# Patient Record
Sex: Female | Born: 1955 | Race: White | Hispanic: No | Marital: Single | Smoking: Never smoker
Health system: Southern US, Community
[De-identification: ages and names within clinical notes are randomized; demographics above are authoritative.]

## PROBLEM LIST (undated history)

## (undated) DIAGNOSIS — E7800 Pure hypercholesterolemia, unspecified: Secondary | ICD-10-CM

---

## 2013-12-03 ENCOUNTER — Emergency Department (HOSPITAL_COMMUNITY)
Admission: EM | Admit: 2013-12-03 | Discharge: 2013-12-03 | Disposition: A | Discharge status: Home / Self Care | Payer: BC Managed Care – PPO | Attending: Emergency Medicine | Admitting: Emergency Medicine

## 2013-12-03 ENCOUNTER — Encounter (HOSPITAL_COMMUNITY): Payer: Self-pay | Admitting: Emergency Medicine

## 2013-12-03 DIAGNOSIS — Z79899 Other long term (current) drug therapy: Secondary | ICD-10-CM | POA: Insufficient documentation

## 2013-12-03 DIAGNOSIS — E7800 Pure hypercholesterolemia, unspecified: Secondary | ICD-10-CM | POA: Insufficient documentation

## 2013-12-03 DIAGNOSIS — Z7982 Long term (current) use of aspirin: Secondary | ICD-10-CM | POA: Insufficient documentation

## 2013-12-03 DIAGNOSIS — R109 Unspecified abdominal pain: Secondary | ICD-10-CM | POA: Insufficient documentation

## 2013-12-03 DIAGNOSIS — Z880 Allergy status to penicillin: Secondary | ICD-10-CM | POA: Insufficient documentation

## 2013-12-03 DIAGNOSIS — Z791 Long term (current) use of non-steroidal anti-inflammatories (NSAID): Secondary | ICD-10-CM | POA: Insufficient documentation

## 2013-12-03 HISTORY — DX: Pure hypercholesterolemia, unspecified: E78.00

## 2013-12-03 LAB — COMPREHENSIVE METABOLIC PANEL
ALT: 15 U/L (ref 0–35)
AST: 17 U/L (ref 0–37)
Alkaline Phosphatase: 59 U/L (ref 39–117)
CO2: 25 mEq/L (ref 19–32)
Calcium: 9.3 mg/dL (ref 8.4–10.5)
Chloride: 99 mEq/L (ref 96–112)
GFR calc Af Amer: >90 mL/min (ref >90)
GFR calc non Af Amer: >90 mL/min (ref >90)
Glucose, Bld: 140 mg/dL — ABNORMAL HIGH (ref 70–99)
Potassium: 3.8 mEq/L (ref 3.5–5.1)
Sodium: 138 mEq/L (ref 135–145)
Total Bilirubin: 0.4 mg/dL (ref 0.3–1.2)

## 2013-12-03 LAB — CBC WITH DIFFERENTIAL/PLATELET
Basophils Absolute: 0 10*3/uL (ref 0.0–0.1)
Eosinophils Relative: 1 % (ref 0–5)
Lymphocytes Relative: 10 % — ABNORMAL LOW (ref 12–46)
Lymphs Abs: 0.6 10*3/uL — ABNORMAL LOW (ref 0.7–4.0)
MCH: 31 pg (ref 26.0–34.0)
MCV: 88.1 fL (ref 78.0–100.0)
Neutro Abs: 4.9 10*3/uL (ref 1.7–7.7)
Neutrophils Relative %: 81 % — ABNORMAL HIGH (ref 43–77)
Platelets: 249 10*3/uL (ref 150–400)
RBC: 4.36 MIL/uL (ref 3.87–5.11)
RDW: 12.1 % (ref 11.5–15.5)
WBC: 6.1 10*3/uL (ref 4.0–10.5)

## 2013-12-03 LAB — URINALYSIS, ROUTINE W REFLEX MICROSCOPIC
Bilirubin Urine: NEGATIVE
Glucose, UA: NEGATIVE mg/dL
Hgb urine dipstick: NEGATIVE
Protein, ur: NEGATIVE mg/dL
Urobilinogen, UA: 0.2 mg/dL (ref 0.0–1.0)

## 2013-12-03 LAB — URINE MICROSCOPIC-ADD ON

## 2013-12-03 MED ORDER — NAPROXEN 500 MG PO TABS: 500.0000 mg | ORAL_TABLET | Freq: Two times a day (BID) | ORAL | Status: AC

## 2013-12-03 MED ORDER — PROMETHAZINE HCL 25 MG PO TABS: 25.0000 mg | ORAL_TABLET | Freq: Four times a day (QID) | ORAL | Status: AC | PRN

## 2013-12-03 MED ORDER — MORPHINE SULFATE 4 MG/ML IJ SOLN
2.0000 mg | Freq: Once | INTRAMUSCULAR | Status: DC
  Filled 2013-12-03: qty 1

## 2013-12-03 MED ORDER — SODIUM CHLORIDE 0.9 % IV SOLN
Freq: Once | INTRAVENOUS | Status: AC
  Administered 2013-12-03: 01:00:00 via INTRAVENOUS

## 2013-12-03 NOTE — ED Notes (Signed)
Pt complains of severe abd pain since about 8pm, she was at a party and ate BBQ, after coming home felt bloated and nauseated and vomited once before coming to ED

## 2013-12-03 NOTE — ED Provider Notes (Signed)
CSN: [id number]     Arrival date & time 12/03/13  0043 History   First MD Initiated Contact with Patient 12/03/13 0048     Chief Complaint  Patient presents with  . Abdominal Pain   (Consider location/radiation/quality/duration/timing/severity/associated sxs/prior Treatment) HPI Comments: 57-year-old female, no prior surgical history presents with abdominal pain which she rates at 9/10 at this time which is both crampy and sharp and stabbing, located in the lower abdomen and the lower right abdomen. She states this started after eating a large amount of food including hamburger, barbecue and alcohol. She states that this is not unusual for her to ingest these foods, this pain waxes and wanes in intensity, there is no associated diarrhea, dysuria but she did have one episode of vomiting once prior to her arrival. She denies radiation to the back, denies chest pain or shortness of breath.  Patient is a 57 y.o. female presenting with abdominal pain. The history is provided by the patient and a relative.  Abdominal Pain   Past Medical History  Diagnosis Date  . High cholesterol    History reviewed. No pertinent past surgical history. History reviewed. No pertinent family history. History  Substance Use Topics  . Smoking status: Never Smoker   . Smokeless tobacco: Not on file  . Alcohol Use: Yes   OB History   Grav Para Term Preterm Abortions TAB SAB Ect Mult Living                 Review of Systems  Gastrointestinal: Positive for abdominal pain.  All other systems reviewed and are negative.    Allergies  Penicillins  Home Medications   Current Outpatient Rx  Name  Route  Sig  Dispense  Refill  . aspirin EC 81 MG tablet   Oral   Take 81 mg by mouth daily.         Marland Kitchen atorvastatin (LIPITOR) 20 MG tablet   Oral   Take 20 mg by mouth daily.         . calcium carbonate (OS-CAL) 600 MG TABS tablet   Oral   Take 600 mg by mouth daily with breakfast.         .  estradiol-norethindrone (COMBIPATCH) 0.05-0.14 MG/DAY   Transdermal   Place 1 patch onto the skin 2 (two) times a week.         . Multiple Vitamin (MULTIVITAMIN WITH MINERALS) TABS tablet   Oral   Take 1 tablet by mouth daily.         . naproxen (NAPROSYN) 500 MG tablet   Oral   Take 1 tablet (500 mg total) by mouth 2 (two) times daily with a meal.   30 tablet   0   . promethazine (PHENERGAN) 25 MG tablet   Oral   Take 1 tablet (25 mg total) by mouth every 6 (six) hours as needed for nausea or vomiting.   12 tablet   0    BP 131/45  Pulse 61  Temp(Src) 97.9 F (36.6 C) (Oral)  Resp 18  SpO2 99% Physical Exam  Nursing note and vitals reviewed. Constitutional: She appears well-developed and well-nourished. No distress.  HENT:  Head: Normocephalic and atraumatic.  Mouth/Throat: Oropharynx is clear and moist. No oropharyngeal exudate.  Eyes: Conjunctivae and EOM are normal. Pupils are equal, round, and reactive to light. Right eye exhibits no discharge. Left eye exhibits no discharge. No scleral icterus.  Neck: Normal range of motion. Neck supple. No JVD present.  No thyromegaly present.  Cardiovascular: Normal rate, regular rhythm, normal heart sounds and intact distal pulses.  Exam reveals no gallop and no friction rub.   No murmur heard. Pulmonary/Chest: Effort normal and breath sounds normal. No respiratory distress. She has no wheezes. She has no rales.  Abdominal: Soft. Bowel sounds are normal. She exhibits no distension and no mass. There is tenderness ( Infraumbilical and suprapubic area tenderness, no guarding, very soft. Minimal right lower tenderness).  Musculoskeletal: Normal range of motion. She exhibits no edema and no tenderness.  Lymphadenopathy:    She has no cervical adenopathy.  Neurological: She is alert. Coordination normal.  Skin: Skin is warm and dry. No rash noted. No erythema.  Psychiatric: She has a normal mood and affect. Her behavior is normal.     ED Course  Procedures (including critical care time) Labs Review Labs Reviewed  CBC WITH DIFFERENTIAL - Abnormal; Notable for the following:    Neutrophils Relative % 81 (*)    Lymphocytes Relative 10 (*)    Lymphs Abs 0.6 (*)    All other components within normal limits  COMPREHENSIVE METABOLIC PANEL - Abnormal; Notable for the following:    Glucose, Bld 140 (*)    All other components within normal limits  LIPASE, BLOOD  URINALYSIS, ROUTINE W REFLEX MICROSCOPIC   Imaging Review No results found.  EKG Interpretation   None       MDM   1. Abdominal pain    The patient has an essentially benign abdominal exam, she has improved significantly after medications and her laboratory workup is normal. She refuses to wait for her urinalysis to results and states that she wants to go home as she is feeling better. Vital signs normal, patient informed of lab results, stable for discharge.    Vida Roller, MD 12/03/13 [phone]

## 2013-12-03 NOTE — ED Notes (Signed)
Pt verbalized that she feels so much better and she wants to home at this time; also states that she does not want to wait for the U/A results.  MD was made aware.

## 2015-03-24 ENCOUNTER — Other Ambulatory Visit: Payer: Self-pay | Admitting: Physician Assistant

## 2015-03-24 ENCOUNTER — Ambulatory Visit
Admission: RE | Admit: 2015-03-24 | Discharge: 2015-03-24 | Disposition: A | Discharge status: Home / Self Care | Payer: Self-pay | Source: Ambulatory Visit | Attending: Physician Assistant | Admitting: Physician Assistant

## 2015-03-24 DIAGNOSIS — R52 Pain, unspecified: Secondary | ICD-10-CM

## 2015-07-22 ENCOUNTER — Ambulatory Visit
Admission: RE | Admit: 2015-07-22 | Discharge: 2015-07-22 | Disposition: A | Discharge status: Home / Self Care | Payer: BC Managed Care – PPO | Source: Ambulatory Visit | Attending: Physician Assistant | Admitting: Physician Assistant

## 2015-07-22 ENCOUNTER — Other Ambulatory Visit: Payer: Self-pay | Admitting: Physician Assistant

## 2015-07-22 DIAGNOSIS — R52 Pain, unspecified: Secondary | ICD-10-CM

## 2016-07-07 IMAGING — CR DG KNEE COMPLETE 4+V*R*
4 series · 4 of 4 positions shown · non-contrast
Comparison: None.

CLINICAL DATA: Bilateral knee pain for 2-3 months which is
increasing

EXAM:
RIGHT KNEE - COMPLETE 4+ VIEW

[w knee ap right]
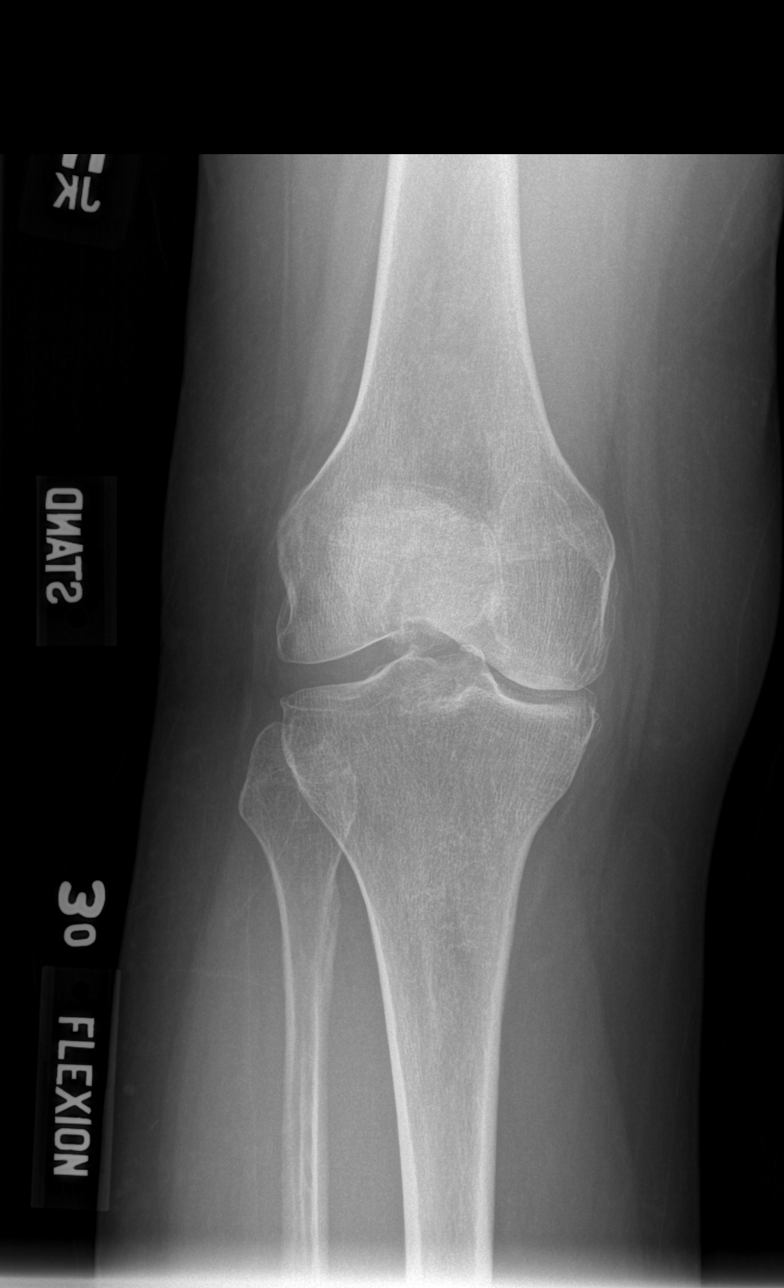

[w knee obl. right (1 of 2)]
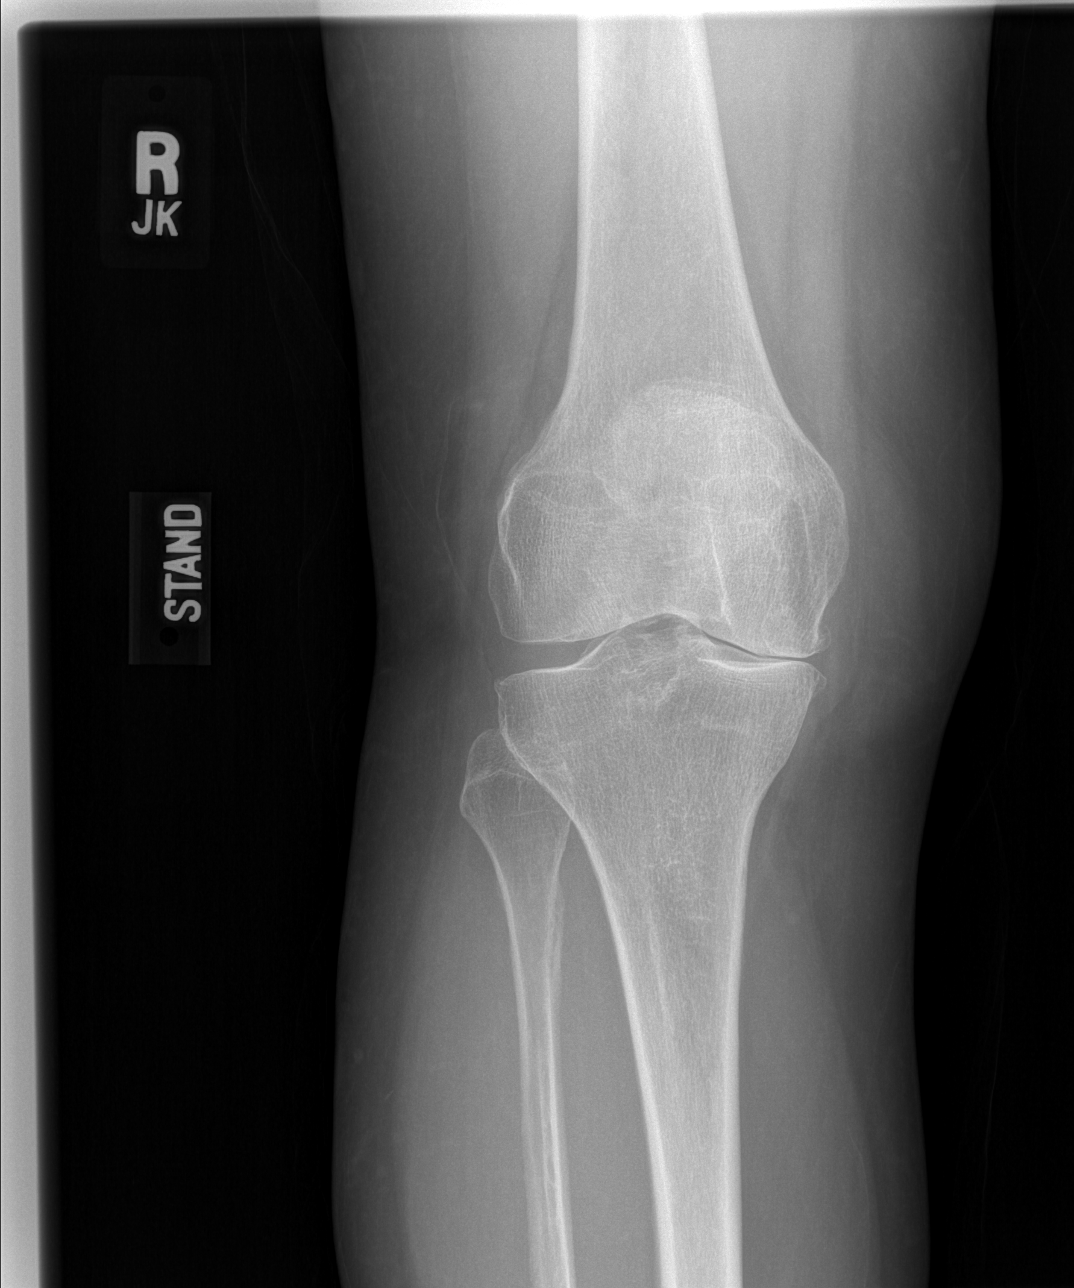

[w knee obl. right (2 of 2)]
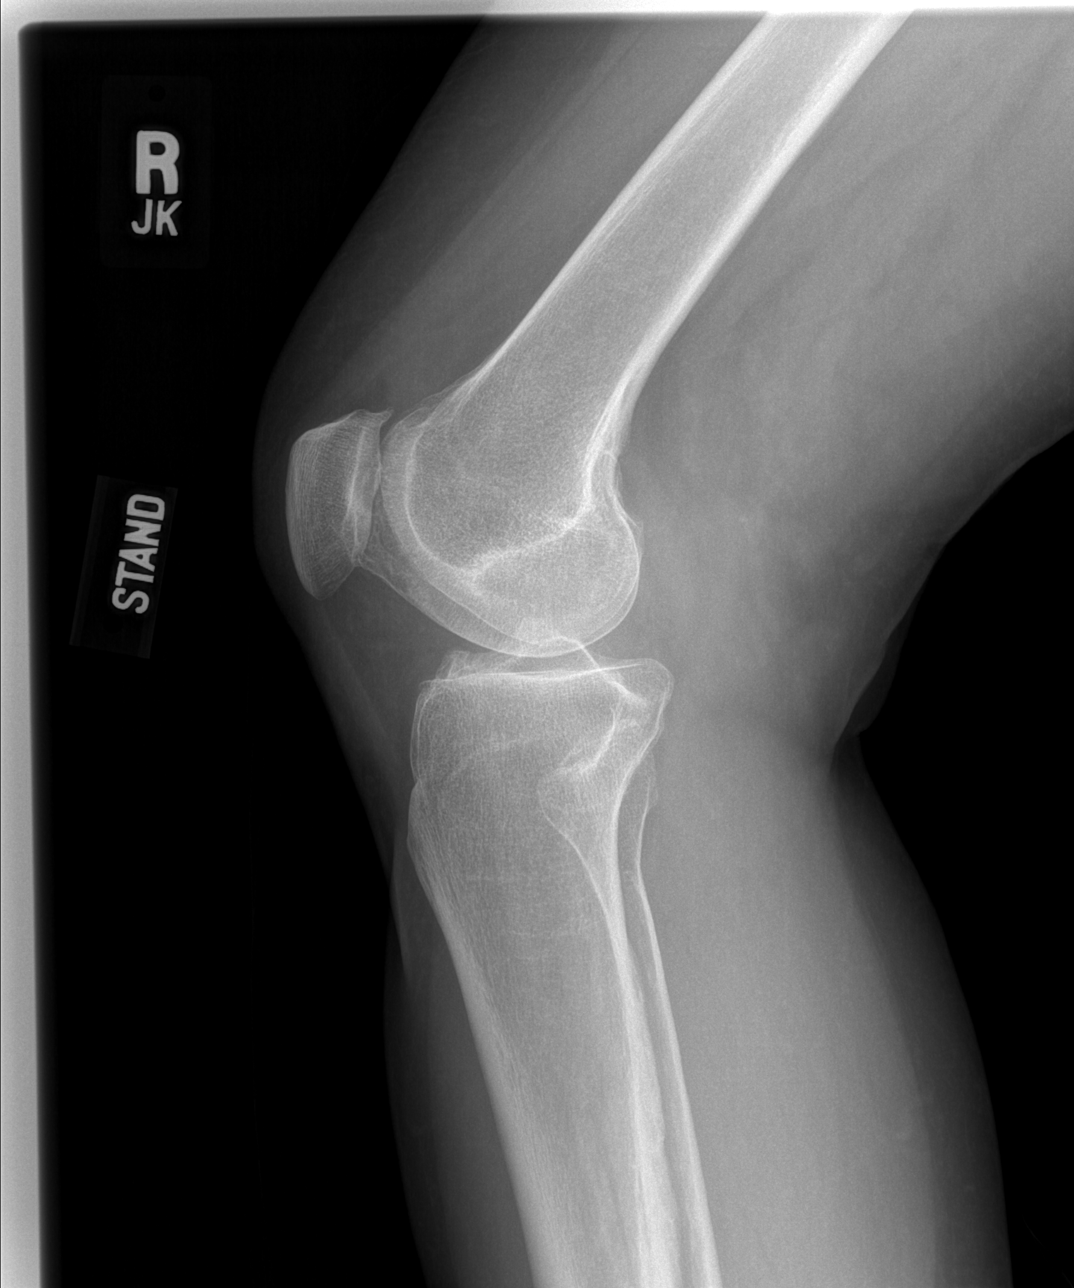

[view not recorded]
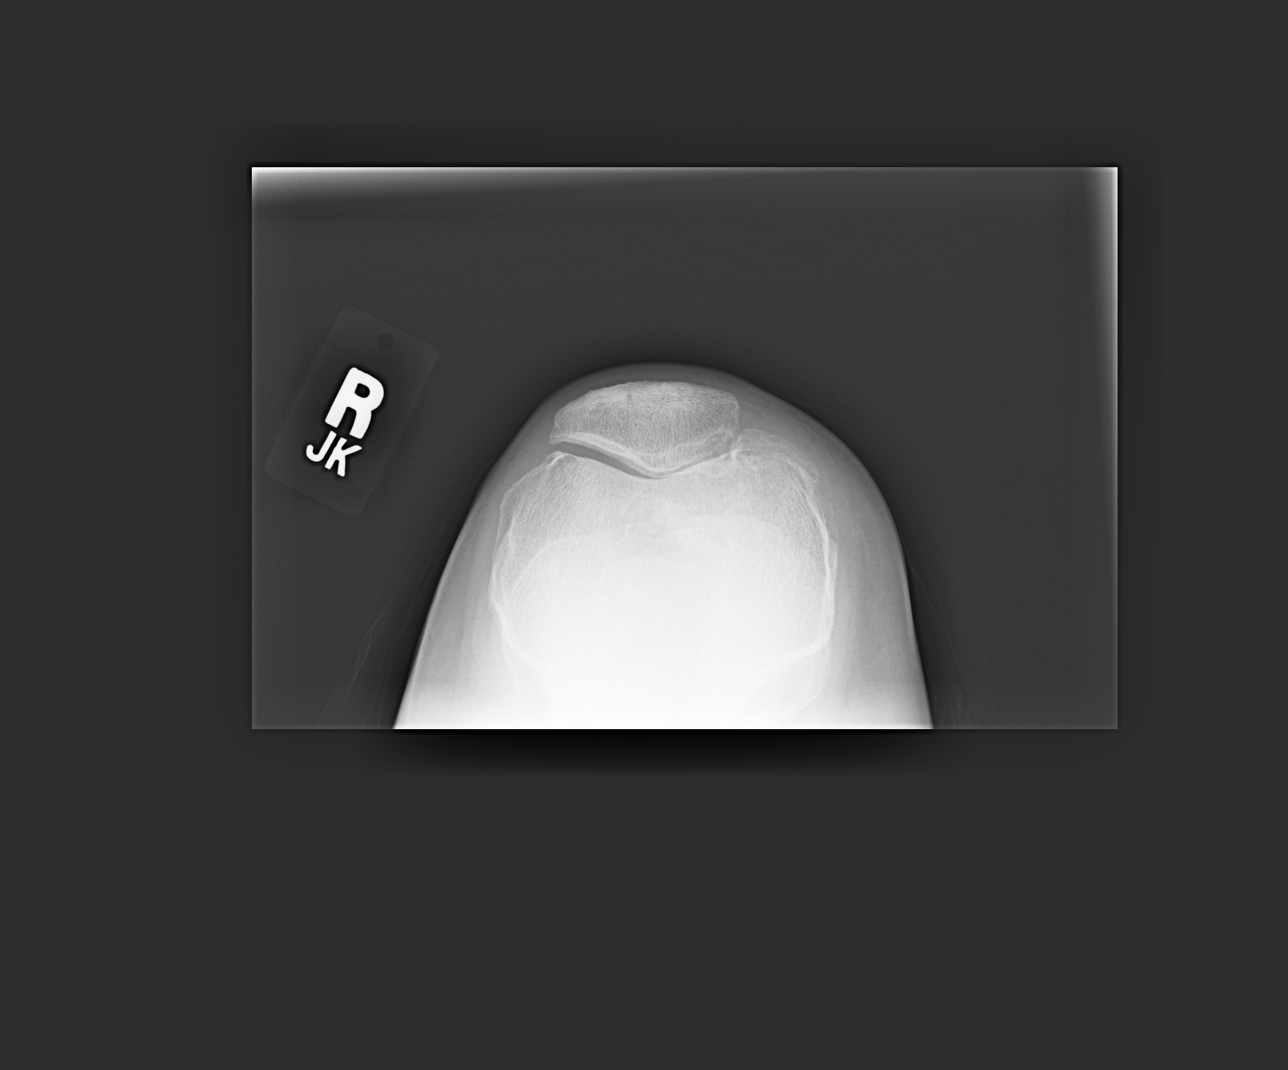

[4 of 4 positions shown; findings below may reference images not displayed]

FINDINGS: There is primarily bicompartmental degenerative joint disease of the
right knee mainly involving the medial compartment where there is
considerable loss of joint space for age with sclerosis and
spurring. No fracture is seen. A tiny joint effusion cannot be
excluded. The patella is normally positioned within the
patellofemoral groove.
IMPRESSION: 1. Bicompartmental degenerative joint disease primarily involving
the medial compartment. No acute abnormality.
2. Cannot exclude a tiny joint space effusion.

## 2020-02-17 ENCOUNTER — Other Ambulatory Visit: Payer: Self-pay

## 2020-02-18 ENCOUNTER — Ambulatory Visit: Payer: BC Managed Care – PPO

## 2020-02-20 ENCOUNTER — Ambulatory Visit: Payer: BC Managed Care – PPO | Attending: Internal Medicine

## 2020-02-20 DIAGNOSIS — Z23 Encounter for immunization: Secondary | ICD-10-CM

## 2020-02-20 NOTE — Progress Notes (Signed)
   Covid-19 Vaccination Clinic  Name:  Marlisa Caridi    MRN: [id number] DOB: September 29, 1956  02/20/2020  Ms. Avants was observed post Covid-19 immunization for 15 minutes without incidence. She was provided with Vaccine Information Sheet and instruction to access the V-Safe system.   Ms. Wivell was instructed to call 911 with any severe reactions post vaccine: Marland Kitchen Difficulty breathing  . Swelling of your face and throat  . A fast heartbeat  . A bad rash all over your body  . Dizziness and weakness    Immunizations Administered    Name Date Dose VIS Date Route   Pfizer COVID-19 Vaccine 02/20/2020 11:04 AM 0.3 mL 12/04/2019 Intramuscular   Manufacturer: [employer], [employer]   Lot: [id number]   NDC: [address]-2

## 2020-03-16 ENCOUNTER — Ambulatory Visit: Payer: BC Managed Care – PPO | Attending: Internal Medicine

## 2020-03-16 DIAGNOSIS — Z23 Encounter for immunization: Secondary | ICD-10-CM

## 2020-03-16 NOTE — Progress Notes (Signed)
   Covid-19 Vaccination Clinic  Name:  Nancy Morse    MRN: [id number] DOB: Jul 15, 1956  03/16/2020  [name] was observed post Covid-19 immunization for 15 minutes without incident. She was provided with Vaccine Information Sheet and instruction to access the V-Safe system.   [name] was instructed to call 911 with any severe reactions post vaccine: Marland Kitchen Difficulty breathing  . Swelling of face and throat  . A fast heartbeat  . A bad rash all over body  . Dizziness and weakness   Immunizations Administered    Name Date Dose VIS Date Route   Pfizer COVID-19 Vaccine 03/16/2020  3:03 PM 0.3 mL 12/04/2019 Intramuscular   Manufacturer: [employer], [employer]   Lot: [id number]   NDC: [address]-0

## 2021-02-23 DIAGNOSIS — R42 Dizziness and giddiness: Secondary | ICD-10-CM | POA: Diagnosis not present

## 2021-04-05 DIAGNOSIS — R42 Dizziness and giddiness: Secondary | ICD-10-CM | POA: Diagnosis not present

## 2021-04-05 DIAGNOSIS — R2689 Other abnormalities of gait and mobility: Secondary | ICD-10-CM | POA: Diagnosis not present

## 2021-04-26 DIAGNOSIS — R2689 Other abnormalities of gait and mobility: Secondary | ICD-10-CM | POA: Diagnosis not present

## 2021-04-26 DIAGNOSIS — R42 Dizziness and giddiness: Secondary | ICD-10-CM | POA: Diagnosis not present

## 2021-05-31 DIAGNOSIS — Z1231 Encounter for screening mammogram for malignant neoplasm of breast: Secondary | ICD-10-CM | POA: Diagnosis not present

## 2021-05-31 DIAGNOSIS — Z01419 Encounter for gynecological examination (general) (routine) without abnormal findings: Secondary | ICD-10-CM | POA: Diagnosis not present

## 2021-05-31 DIAGNOSIS — Z6823 Body mass index (BMI) 23.0-23.9, adult: Secondary | ICD-10-CM | POA: Diagnosis not present

## 2021-06-06 DIAGNOSIS — R1900 Intra-abdominal and pelvic swelling, mass and lump, unspecified site: Secondary | ICD-10-CM | POA: Diagnosis not present

## 2021-06-11 DIAGNOSIS — L255 Unspecified contact dermatitis due to plants, except food: Secondary | ICD-10-CM | POA: Diagnosis not present

## 2021-06-13 DIAGNOSIS — K439 Ventral hernia without obstruction or gangrene: Secondary | ICD-10-CM | POA: Diagnosis not present

## 2021-07-04 DIAGNOSIS — Z23 Encounter for immunization: Secondary | ICD-10-CM | POA: Diagnosis not present

## 2021-07-04 DIAGNOSIS — Z1211 Encounter for screening for malignant neoplasm of colon: Secondary | ICD-10-CM | POA: Diagnosis not present

## 2021-07-04 DIAGNOSIS — D485 Neoplasm of uncertain behavior of skin: Secondary | ICD-10-CM | POA: Diagnosis not present

## 2021-07-04 DIAGNOSIS — Z0000 Encounter for general adult medical examination without abnormal findings: Secondary | ICD-10-CM | POA: Diagnosis not present

## 2021-07-04 DIAGNOSIS — E7800 Pure hypercholesterolemia, unspecified: Secondary | ICD-10-CM | POA: Diagnosis not present

## 2021-07-04 DIAGNOSIS — F339 Major depressive disorder, recurrent, unspecified: Secondary | ICD-10-CM | POA: Diagnosis not present

## 2021-07-18 DIAGNOSIS — H6123 Impacted cerumen, bilateral: Secondary | ICD-10-CM | POA: Diagnosis not present

## 2021-07-18 DIAGNOSIS — H903 Sensorineural hearing loss, bilateral: Secondary | ICD-10-CM | POA: Diagnosis not present

## 2021-07-18 DIAGNOSIS — R42 Dizziness and giddiness: Secondary | ICD-10-CM | POA: Diagnosis not present

## 2021-08-01 DIAGNOSIS — K436 Other and unspecified ventral hernia with obstruction, without gangrene: Secondary | ICD-10-CM | POA: Diagnosis not present

## 2021-10-20 DIAGNOSIS — Z03818 Encounter for observation for suspected exposure to other biological agents ruled out: Secondary | ICD-10-CM | POA: Diagnosis not present

## 2021-10-20 DIAGNOSIS — Z20822 Contact with and (suspected) exposure to covid-19: Secondary | ICD-10-CM | POA: Diagnosis not present

## 2022-02-04 DIAGNOSIS — R509 Fever, unspecified: Secondary | ICD-10-CM | POA: Diagnosis not present

## 2022-02-04 DIAGNOSIS — Z03818 Encounter for observation for suspected exposure to other biological agents ruled out: Secondary | ICD-10-CM | POA: Diagnosis not present

## 2022-02-04 DIAGNOSIS — J019 Acute sinusitis, unspecified: Secondary | ICD-10-CM | POA: Diagnosis not present

## 2022-02-04 DIAGNOSIS — R059 Cough, unspecified: Secondary | ICD-10-CM | POA: Diagnosis not present

## 2022-07-05 DIAGNOSIS — Z79899 Other long term (current) drug therapy: Secondary | ICD-10-CM | POA: Diagnosis not present

## 2022-07-05 DIAGNOSIS — Z0000 Encounter for general adult medical examination without abnormal findings: Secondary | ICD-10-CM | POA: Diagnosis not present

## 2022-07-05 DIAGNOSIS — Z23 Encounter for immunization: Secondary | ICD-10-CM | POA: Diagnosis not present

## 2022-07-05 DIAGNOSIS — F339 Major depressive disorder, recurrent, unspecified: Secondary | ICD-10-CM | POA: Diagnosis not present

## 2022-07-05 DIAGNOSIS — E7800 Pure hypercholesterolemia, unspecified: Secondary | ICD-10-CM | POA: Diagnosis not present

## 2023-02-07 DIAGNOSIS — M25561 Pain in right knee: Secondary | ICD-10-CM | POA: Diagnosis not present

## 2023-02-07 DIAGNOSIS — M1711 Unilateral primary osteoarthritis, right knee: Secondary | ICD-10-CM | POA: Diagnosis not present

## 2023-02-07 DIAGNOSIS — Z96652 Presence of left artificial knee joint: Secondary | ICD-10-CM | POA: Diagnosis not present

## 2023-02-11 DIAGNOSIS — K635 Polyp of colon: Secondary | ICD-10-CM | POA: Diagnosis not present

## 2023-02-11 DIAGNOSIS — Z86010 Personal history of colonic polyps: Secondary | ICD-10-CM | POA: Diagnosis not present

## 2023-02-11 DIAGNOSIS — K648 Other hemorrhoids: Secondary | ICD-10-CM | POA: Diagnosis not present

## 2023-03-12 DIAGNOSIS — M25561 Pain in right knee: Secondary | ICD-10-CM | POA: Diagnosis not present

## 2023-03-12 DIAGNOSIS — M1711 Unilateral primary osteoarthritis, right knee: Secondary | ICD-10-CM | POA: Diagnosis not present

## 2023-04-24 DIAGNOSIS — L259 Unspecified contact dermatitis, unspecified cause: Secondary | ICD-10-CM | POA: Diagnosis not present

## 2023-07-09 DIAGNOSIS — Z5181 Encounter for therapeutic drug level monitoring: Secondary | ICD-10-CM | POA: Diagnosis not present

## 2023-07-09 DIAGNOSIS — Z23 Encounter for immunization: Secondary | ICD-10-CM | POA: Diagnosis not present

## 2023-07-09 DIAGNOSIS — Z0000 Encounter for general adult medical examination without abnormal findings: Secondary | ICD-10-CM | POA: Diagnosis not present

## 2023-07-09 DIAGNOSIS — E7800 Pure hypercholesterolemia, unspecified: Secondary | ICD-10-CM | POA: Diagnosis not present

## 2023-07-09 DIAGNOSIS — F339 Major depressive disorder, recurrent, unspecified: Secondary | ICD-10-CM | POA: Diagnosis not present

## 2023-07-12 DIAGNOSIS — M25511 Pain in right shoulder: Secondary | ICD-10-CM | POA: Diagnosis not present

## 2023-07-12 DIAGNOSIS — M25512 Pain in left shoulder: Secondary | ICD-10-CM | POA: Diagnosis not present

## 2023-07-22 DIAGNOSIS — M1711 Unilateral primary osteoarthritis, right knee: Secondary | ICD-10-CM | POA: Diagnosis not present

## 2023-07-22 DIAGNOSIS — G8918 Other acute postprocedural pain: Secondary | ICD-10-CM | POA: Diagnosis not present

## 2023-07-24 DIAGNOSIS — M25561 Pain in right knee: Secondary | ICD-10-CM | POA: Diagnosis not present

## 2023-07-24 DIAGNOSIS — M25661 Stiffness of right knee, not elsewhere classified: Secondary | ICD-10-CM | POA: Diagnosis not present

## 2023-07-26 DIAGNOSIS — M25661 Stiffness of right knee, not elsewhere classified: Secondary | ICD-10-CM | POA: Diagnosis not present

## 2023-07-29 DIAGNOSIS — M25561 Pain in right knee: Secondary | ICD-10-CM | POA: Diagnosis not present

## 2023-07-29 DIAGNOSIS — M25661 Stiffness of right knee, not elsewhere classified: Secondary | ICD-10-CM | POA: Diagnosis not present

## 2023-07-31 DIAGNOSIS — M25661 Stiffness of right knee, not elsewhere classified: Secondary | ICD-10-CM | POA: Diagnosis not present

## 2023-07-31 DIAGNOSIS — M25561 Pain in right knee: Secondary | ICD-10-CM | POA: Diagnosis not present

## 2023-08-02 DIAGNOSIS — M25661 Stiffness of right knee, not elsewhere classified: Secondary | ICD-10-CM | POA: Diagnosis not present

## 2023-08-05 DIAGNOSIS — M25661 Stiffness of right knee, not elsewhere classified: Secondary | ICD-10-CM | POA: Diagnosis not present

## 2023-08-05 DIAGNOSIS — M25561 Pain in right knee: Secondary | ICD-10-CM | POA: Diagnosis not present

## 2023-08-07 DIAGNOSIS — M25561 Pain in right knee: Secondary | ICD-10-CM | POA: Diagnosis not present

## 2023-08-07 DIAGNOSIS — M25661 Stiffness of right knee, not elsewhere classified: Secondary | ICD-10-CM | POA: Diagnosis not present

## 2023-08-09 DIAGNOSIS — M25661 Stiffness of right knee, not elsewhere classified: Secondary | ICD-10-CM | POA: Diagnosis not present

## 2023-08-09 DIAGNOSIS — M25561 Pain in right knee: Secondary | ICD-10-CM | POA: Diagnosis not present

## 2023-08-14 DIAGNOSIS — M25661 Stiffness of right knee, not elsewhere classified: Secondary | ICD-10-CM | POA: Diagnosis not present

## 2023-08-14 DIAGNOSIS — M25561 Pain in right knee: Secondary | ICD-10-CM | POA: Diagnosis not present

## 2023-08-16 DIAGNOSIS — M25561 Pain in right knee: Secondary | ICD-10-CM | POA: Diagnosis not present

## 2023-08-19 DIAGNOSIS — M25661 Stiffness of right knee, not elsewhere classified: Secondary | ICD-10-CM | POA: Diagnosis not present

## 2023-08-19 DIAGNOSIS — M25561 Pain in right knee: Secondary | ICD-10-CM | POA: Diagnosis not present

## 2023-08-21 DIAGNOSIS — M25561 Pain in right knee: Secondary | ICD-10-CM | POA: Diagnosis not present

## 2023-08-21 DIAGNOSIS — M25661 Stiffness of right knee, not elsewhere classified: Secondary | ICD-10-CM | POA: Diagnosis not present

## 2023-08-23 DIAGNOSIS — M25561 Pain in right knee: Secondary | ICD-10-CM | POA: Diagnosis not present

## 2023-08-23 DIAGNOSIS — M25661 Stiffness of right knee, not elsewhere classified: Secondary | ICD-10-CM | POA: Diagnosis not present

## 2023-08-28 DIAGNOSIS — M25561 Pain in right knee: Secondary | ICD-10-CM | POA: Diagnosis not present

## 2023-08-28 DIAGNOSIS — M25661 Stiffness of right knee, not elsewhere classified: Secondary | ICD-10-CM | POA: Diagnosis not present

## 2023-09-03 DIAGNOSIS — M25561 Pain in right knee: Secondary | ICD-10-CM | POA: Diagnosis not present

## 2023-09-03 DIAGNOSIS — M25661 Stiffness of right knee, not elsewhere classified: Secondary | ICD-10-CM | POA: Diagnosis not present

## 2023-09-09 DIAGNOSIS — M25561 Pain in right knee: Secondary | ICD-10-CM | POA: Diagnosis not present

## 2023-09-09 DIAGNOSIS — M25661 Stiffness of right knee, not elsewhere classified: Secondary | ICD-10-CM | POA: Diagnosis not present

## 2023-09-11 DIAGNOSIS — M25661 Stiffness of right knee, not elsewhere classified: Secondary | ICD-10-CM | POA: Diagnosis not present

## 2023-09-11 DIAGNOSIS — M25561 Pain in right knee: Secondary | ICD-10-CM | POA: Diagnosis not present

## 2023-09-18 DIAGNOSIS — M25561 Pain in right knee: Secondary | ICD-10-CM | POA: Diagnosis not present

## 2023-09-18 DIAGNOSIS — M25661 Stiffness of right knee, not elsewhere classified: Secondary | ICD-10-CM | POA: Diagnosis not present

## 2023-09-20 DIAGNOSIS — M25661 Stiffness of right knee, not elsewhere classified: Secondary | ICD-10-CM | POA: Diagnosis not present

## 2023-09-20 DIAGNOSIS — M25561 Pain in right knee: Secondary | ICD-10-CM | POA: Diagnosis not present

## 2023-09-25 DIAGNOSIS — M25661 Stiffness of right knee, not elsewhere classified: Secondary | ICD-10-CM | POA: Diagnosis not present

## 2023-09-25 DIAGNOSIS — M25561 Pain in right knee: Secondary | ICD-10-CM | POA: Diagnosis not present

## 2023-10-07 DIAGNOSIS — M25661 Stiffness of right knee, not elsewhere classified: Secondary | ICD-10-CM | POA: Diagnosis not present

## 2023-10-07 DIAGNOSIS — M25561 Pain in right knee: Secondary | ICD-10-CM | POA: Diagnosis not present

## 2023-10-11 DIAGNOSIS — M25561 Pain in right knee: Secondary | ICD-10-CM | POA: Diagnosis not present

## 2023-10-11 DIAGNOSIS — M25661 Stiffness of right knee, not elsewhere classified: Secondary | ICD-10-CM | POA: Diagnosis not present

## 2023-10-14 DIAGNOSIS — M25561 Pain in right knee: Secondary | ICD-10-CM | POA: Diagnosis not present

## 2023-10-14 DIAGNOSIS — M25661 Stiffness of right knee, not elsewhere classified: Secondary | ICD-10-CM | POA: Diagnosis not present

## 2023-10-15 DIAGNOSIS — Z124 Encounter for screening for malignant neoplasm of cervix: Secondary | ICD-10-CM | POA: Diagnosis not present

## 2023-10-15 DIAGNOSIS — Z6822 Body mass index (BMI) 22.0-22.9, adult: Secondary | ICD-10-CM | POA: Diagnosis not present

## 2023-10-15 DIAGNOSIS — Z13820 Encounter for screening for osteoporosis: Secondary | ICD-10-CM | POA: Diagnosis not present

## 2023-10-15 DIAGNOSIS — Z1231 Encounter for screening mammogram for malignant neoplasm of breast: Secondary | ICD-10-CM | POA: Diagnosis not present

## 2023-10-15 DIAGNOSIS — Z01419 Encounter for gynecological examination (general) (routine) without abnormal findings: Secondary | ICD-10-CM | POA: Diagnosis not present

## 2023-10-16 DIAGNOSIS — M25661 Stiffness of right knee, not elsewhere classified: Secondary | ICD-10-CM | POA: Diagnosis not present

## 2023-10-16 DIAGNOSIS — M25561 Pain in right knee: Secondary | ICD-10-CM | POA: Diagnosis not present

## 2023-10-21 DIAGNOSIS — M25661 Stiffness of right knee, not elsewhere classified: Secondary | ICD-10-CM | POA: Diagnosis not present

## 2023-10-21 DIAGNOSIS — M25561 Pain in right knee: Secondary | ICD-10-CM | POA: Diagnosis not present

## 2023-10-24 DIAGNOSIS — M25661 Stiffness of right knee, not elsewhere classified: Secondary | ICD-10-CM | POA: Diagnosis not present

## 2023-10-24 DIAGNOSIS — M25561 Pain in right knee: Secondary | ICD-10-CM | POA: Diagnosis not present

## 2023-10-30 DIAGNOSIS — M25661 Stiffness of right knee, not elsewhere classified: Secondary | ICD-10-CM | POA: Diagnosis not present

## 2023-10-30 DIAGNOSIS — M25561 Pain in right knee: Secondary | ICD-10-CM | POA: Diagnosis not present

## 2023-10-31 DIAGNOSIS — M1711 Unilateral primary osteoarthritis, right knee: Secondary | ICD-10-CM | POA: Diagnosis not present

## 2023-11-06 DIAGNOSIS — M25561 Pain in right knee: Secondary | ICD-10-CM | POA: Diagnosis not present

## 2023-11-06 DIAGNOSIS — M25661 Stiffness of right knee, not elsewhere classified: Secondary | ICD-10-CM | POA: Diagnosis not present

## 2023-11-26 DIAGNOSIS — M25561 Pain in right knee: Secondary | ICD-10-CM | POA: Diagnosis not present

## 2023-11-26 DIAGNOSIS — M25661 Stiffness of right knee, not elsewhere classified: Secondary | ICD-10-CM | POA: Diagnosis not present

## 2024-01-09 DIAGNOSIS — F4323 Adjustment disorder with mixed anxiety and depressed mood: Secondary | ICD-10-CM | POA: Diagnosis not present

## 2024-01-23 DIAGNOSIS — F4323 Adjustment disorder with mixed anxiety and depressed mood: Secondary | ICD-10-CM | POA: Diagnosis not present

## 2024-03-19 DIAGNOSIS — F4323 Adjustment disorder with mixed anxiety and depressed mood: Secondary | ICD-10-CM | POA: Diagnosis not present

## 2024-04-29 DIAGNOSIS — F4323 Adjustment disorder with mixed anxiety and depressed mood: Secondary | ICD-10-CM | POA: Diagnosis not present

## 2024-06-18 DIAGNOSIS — F4323 Adjustment disorder with mixed anxiety and depressed mood: Secondary | ICD-10-CM | POA: Diagnosis not present

## 2024-07-13 DIAGNOSIS — M810 Age-related osteoporosis without current pathological fracture: Secondary | ICD-10-CM | POA: Diagnosis not present

## 2024-07-13 DIAGNOSIS — F339 Major depressive disorder, recurrent, unspecified: Secondary | ICD-10-CM | POA: Diagnosis not present

## 2024-07-13 DIAGNOSIS — E7800 Pure hypercholesterolemia, unspecified: Secondary | ICD-10-CM | POA: Diagnosis not present

## 2024-07-13 DIAGNOSIS — Z23 Encounter for immunization: Secondary | ICD-10-CM | POA: Diagnosis not present

## 2024-07-13 DIAGNOSIS — Z0000 Encounter for general adult medical examination without abnormal findings: Secondary | ICD-10-CM | POA: Diagnosis not present

## 2024-07-13 DIAGNOSIS — Z1331 Encounter for screening for depression: Secondary | ICD-10-CM | POA: Diagnosis not present

## 2024-07-30 DIAGNOSIS — F4323 Adjustment disorder with mixed anxiety and depressed mood: Secondary | ICD-10-CM | POA: Diagnosis not present

## 2024-10-19 DIAGNOSIS — Z1231 Encounter for screening mammogram for malignant neoplasm of breast: Secondary | ICD-10-CM | POA: Diagnosis not present

## 2024-10-19 DIAGNOSIS — Z6824 Body mass index (BMI) 24.0-24.9, adult: Secondary | ICD-10-CM | POA: Diagnosis not present

## 2024-10-19 DIAGNOSIS — Z01419 Encounter for gynecological examination (general) (routine) without abnormal findings: Secondary | ICD-10-CM | POA: Diagnosis not present

## 2024-11-04 DIAGNOSIS — F411 Generalized anxiety disorder: Secondary | ICD-10-CM | POA: Diagnosis not present
